# Patient Record
Sex: Male | Born: 2000 | Hispanic: No | Marital: Single | Smoking: Never smoker
Health system: Southern US, Community
[De-identification: ages and names within clinical notes are randomized; demographics above are authoritative.]

## PROBLEM LIST (undated history)

## (undated) DIAGNOSIS — Z789 Other specified health status: Secondary | ICD-10-CM

## (undated) HISTORY — PX: NO PAST SURGERIES: SHX2092

## (undated) HISTORY — DX: Other specified health status: Z78.9

---
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
## 2021-01-09 DIAGNOSIS — L209 Atopic dermatitis, unspecified: Secondary | ICD-10-CM | POA: Diagnosis not present

## 2021-01-09 DIAGNOSIS — R0982 Postnasal drip: Secondary | ICD-10-CM | POA: Diagnosis not present

## 2021-01-09 DIAGNOSIS — L272 Dermatitis due to ingested food: Secondary | ICD-10-CM | POA: Diagnosis not present

## 2021-01-12 DIAGNOSIS — Z87898 Personal history of other specified conditions: Secondary | ICD-10-CM | POA: Diagnosis not present

## 2021-01-12 DIAGNOSIS — L209 Atopic dermatitis, unspecified: Secondary | ICD-10-CM | POA: Diagnosis not present

## 2021-01-12 DIAGNOSIS — J3089 Other allergic rhinitis: Secondary | ICD-10-CM | POA: Diagnosis not present

## 2021-01-12 DIAGNOSIS — B369 Superficial mycosis, unspecified: Secondary | ICD-10-CM | POA: Diagnosis not present

## 2021-01-19 DIAGNOSIS — B369 Superficial mycosis, unspecified: Secondary | ICD-10-CM | POA: Diagnosis not present

## 2021-01-19 DIAGNOSIS — J3089 Other allergic rhinitis: Secondary | ICD-10-CM | POA: Diagnosis not present

## 2021-01-19 DIAGNOSIS — L209 Atopic dermatitis, unspecified: Secondary | ICD-10-CM | POA: Diagnosis not present

## 2021-01-19 DIAGNOSIS — Z87898 Personal history of other specified conditions: Secondary | ICD-10-CM | POA: Diagnosis not present

## 2021-01-20 DIAGNOSIS — L209 Atopic dermatitis, unspecified: Secondary | ICD-10-CM | POA: Diagnosis not present

## 2021-02-03 DIAGNOSIS — L209 Atopic dermatitis, unspecified: Secondary | ICD-10-CM | POA: Diagnosis not present

## 2021-02-03 DIAGNOSIS — Z87898 Personal history of other specified conditions: Secondary | ICD-10-CM | POA: Diagnosis not present

## 2021-02-03 DIAGNOSIS — J3089 Other allergic rhinitis: Secondary | ICD-10-CM | POA: Diagnosis not present

## 2021-02-03 DIAGNOSIS — B369 Superficial mycosis, unspecified: Secondary | ICD-10-CM | POA: Diagnosis not present

## 2021-03-11 DIAGNOSIS — J029 Acute pharyngitis, unspecified: Secondary | ICD-10-CM | POA: Diagnosis not present

## 2021-03-11 DIAGNOSIS — R509 Fever, unspecified: Secondary | ICD-10-CM | POA: Diagnosis not present

## 2021-03-11 DIAGNOSIS — J988 Other specified respiratory disorders: Secondary | ICD-10-CM | POA: Diagnosis not present

## 2021-03-11 DIAGNOSIS — Z20822 Contact with and (suspected) exposure to covid-19: Secondary | ICD-10-CM | POA: Diagnosis not present

## 2021-04-21 DIAGNOSIS — Z87898 Personal history of other specified conditions: Secondary | ICD-10-CM | POA: Diagnosis not present

## 2021-04-21 DIAGNOSIS — L209 Atopic dermatitis, unspecified: Secondary | ICD-10-CM | POA: Diagnosis not present

## 2021-04-21 DIAGNOSIS — J3089 Other allergic rhinitis: Secondary | ICD-10-CM | POA: Diagnosis not present

## 2021-04-21 DIAGNOSIS — B369 Superficial mycosis, unspecified: Secondary | ICD-10-CM | POA: Diagnosis not present

## 2021-05-12 DIAGNOSIS — J3089 Other allergic rhinitis: Secondary | ICD-10-CM | POA: Diagnosis not present

## 2021-05-12 DIAGNOSIS — L2089 Other atopic dermatitis: Secondary | ICD-10-CM | POA: Diagnosis not present

## 2021-06-02 DIAGNOSIS — Z9109 Other allergy status, other than to drugs and biological substances: Secondary | ICD-10-CM | POA: Diagnosis not present

## 2021-06-09 DIAGNOSIS — J3089 Other allergic rhinitis: Secondary | ICD-10-CM | POA: Diagnosis not present

## 2021-06-09 DIAGNOSIS — J302 Other seasonal allergic rhinitis: Secondary | ICD-10-CM | POA: Diagnosis not present

## 2021-06-16 DIAGNOSIS — J3089 Other allergic rhinitis: Secondary | ICD-10-CM | POA: Diagnosis not present

## 2021-06-16 DIAGNOSIS — J302 Other seasonal allergic rhinitis: Secondary | ICD-10-CM | POA: Diagnosis not present

## 2021-06-23 DIAGNOSIS — J3089 Other allergic rhinitis: Secondary | ICD-10-CM | POA: Diagnosis not present

## 2021-06-23 DIAGNOSIS — J302 Other seasonal allergic rhinitis: Secondary | ICD-10-CM | POA: Diagnosis not present

## 2021-06-30 DIAGNOSIS — J3089 Other allergic rhinitis: Secondary | ICD-10-CM | POA: Diagnosis not present

## 2021-06-30 DIAGNOSIS — J302 Other seasonal allergic rhinitis: Secondary | ICD-10-CM | POA: Diagnosis not present

## 2021-07-07 DIAGNOSIS — J302 Other seasonal allergic rhinitis: Secondary | ICD-10-CM | POA: Diagnosis not present

## 2021-07-07 DIAGNOSIS — J3089 Other allergic rhinitis: Secondary | ICD-10-CM | POA: Diagnosis not present

## 2021-07-14 DIAGNOSIS — J302 Other seasonal allergic rhinitis: Secondary | ICD-10-CM | POA: Diagnosis not present

## 2021-07-14 DIAGNOSIS — J3089 Other allergic rhinitis: Secondary | ICD-10-CM | POA: Diagnosis not present

## 2021-07-21 DIAGNOSIS — J302 Other seasonal allergic rhinitis: Secondary | ICD-10-CM | POA: Diagnosis not present

## 2021-07-21 DIAGNOSIS — J3089 Other allergic rhinitis: Secondary | ICD-10-CM | POA: Diagnosis not present

## 2021-07-23 DIAGNOSIS — L2089 Other atopic dermatitis: Secondary | ICD-10-CM | POA: Diagnosis not present

## 2021-07-23 DIAGNOSIS — J3089 Other allergic rhinitis: Secondary | ICD-10-CM | POA: Diagnosis not present

## 2021-07-28 DIAGNOSIS — J3089 Other allergic rhinitis: Secondary | ICD-10-CM | POA: Diagnosis not present

## 2021-07-28 DIAGNOSIS — J302 Other seasonal allergic rhinitis: Secondary | ICD-10-CM | POA: Diagnosis not present

## 2021-08-04 DIAGNOSIS — J302 Other seasonal allergic rhinitis: Secondary | ICD-10-CM | POA: Diagnosis not present

## 2021-08-04 DIAGNOSIS — J3089 Other allergic rhinitis: Secondary | ICD-10-CM | POA: Diagnosis not present

## 2021-08-11 DIAGNOSIS — J3089 Other allergic rhinitis: Secondary | ICD-10-CM | POA: Diagnosis not present

## 2021-08-11 DIAGNOSIS — J302 Other seasonal allergic rhinitis: Secondary | ICD-10-CM | POA: Diagnosis not present

## 2021-08-24 ENCOUNTER — Encounter: Payer: Self-pay | Admitting: Family Medicine

## 2021-08-24 ENCOUNTER — Ambulatory Visit (INDEPENDENT_AMBULATORY_CARE_PROVIDER_SITE_OTHER): Payer: Medicaid Other | Admitting: Family Medicine

## 2021-08-24 VITALS — BP 116/71 | HR 73 | Temp 98.0°F | Resp 16 | Ht 66.0 in | Wt 170.8 lb

## 2021-08-24 DIAGNOSIS — Z7689 Persons encountering health services in other specified circumstances: Secondary | ICD-10-CM | POA: Diagnosis not present

## 2021-08-24 DIAGNOSIS — Z0000 Encounter for general adult medical examination without abnormal findings: Secondary | ICD-10-CM | POA: Diagnosis not present

## 2021-08-24 NOTE — Progress Notes (Signed)
Patient is a 21 yo male here to established care with pcp.  Patient has no concerns for PCP

## 2021-08-25 ENCOUNTER — Encounter: Payer: Self-pay | Admitting: Family Medicine

## 2021-08-25 DIAGNOSIS — J302 Other seasonal allergic rhinitis: Secondary | ICD-10-CM | POA: Diagnosis not present

## 2021-08-25 DIAGNOSIS — J3089 Other allergic rhinitis: Secondary | ICD-10-CM | POA: Diagnosis not present

## 2021-08-25 LAB — CMP14+EGFR
ALT: 14 IU/L (ref 0–44)
AST: 18 IU/L (ref 0–40)
Albumin/Globulin Ratio: 1.8 (ref 1.2–2.2)
Albumin: 4.6 g/dL (ref 4.1–5.2)
Alkaline Phosphatase: 80 IU/L (ref 51–125)
BUN/Creatinine Ratio: 12 (ref 9–20)
BUN: 12 mg/dL (ref 6–20)
Bilirubin Total: 1 mg/dL (ref 0.0–1.2)
CO2: 24 mmol/L (ref 20–29)
Calcium: 9.3 mg/dL (ref 8.7–10.2)
Chloride: 105 mmol/L (ref 96–106)
Creatinine, Ser: 0.97 mg/dL (ref 0.76–1.27)
Globulin, Total: 2.5 g/dL (ref 1.5–4.5)
Glucose: 78 mg/dL (ref 70–99)
Potassium: 4 mmol/L (ref 3.5–5.2)
Sodium: 141 mmol/L (ref 134–144)
Total Protein: 7.1 g/dL (ref 6.0–8.5)
eGFR: 115 mL/min/{1.73_m2} (ref >59)

## 2021-08-25 LAB — CBC WITH DIFFERENTIAL/PLATELET
Basophils Absolute: 0 10*3/uL (ref 0.0–0.2)
Basos: 1 %
EOS (ABSOLUTE): 0.2 10*3/uL (ref 0.0–0.4)
Eos: 3 %
Hematocrit: 42.7 % (ref 37.5–51.0)
Hemoglobin: 14.4 g/dL (ref 13.0–17.7)
Immature Grans (Abs): 0 10*3/uL (ref 0.0–0.1)
Immature Granulocytes: 0 %
Lymphocytes Absolute: 2 10*3/uL (ref 0.7–3.1)
Lymphs: 32 %
MCH: 31.2 pg (ref 26.6–33.0)
MCHC: 33.7 g/dL (ref 31.5–35.7)
MCV: 93 fL (ref 79–97)
Monocytes Absolute: 0.4 10*3/uL (ref 0.1–0.9)
Monocytes: 7 %
Neutrophils Absolute: 3.5 10*3/uL (ref 1.4–7.0)
Neutrophils: 57 %
Platelets: 307 10*3/uL (ref 150–450)
RBC: 4.61 x10E6/uL (ref 4.14–5.80)
RDW: 12.7 % (ref 11.6–15.4)
WBC: 6.1 10*3/uL (ref 3.4–10.8)

## 2021-08-25 LAB — LIPID PANEL
Chol/HDL Ratio: 2.2 ratio (ref 0.0–5.0)
Cholesterol, Total: 149 mg/dL (ref 100–199)
HDL: 68 mg/dL (ref >39)
LDL Chol Calc (NIH): 63 mg/dL (ref 0–99)
Triglycerides: 96 mg/dL (ref 0–149)
VLDL Cholesterol Cal: 18 mg/dL (ref 5–40)

## 2021-08-25 NOTE — Progress Notes (Signed)
New Patient Office Visit  Subjective    Patient ID: Larry Morales, male    DOB: 2000-04-08  Age: 21 y.o. MRN: [id number]  CC:  Chief Complaint  Patient presents with   Establish Care   Annual Exam    HPI Larry Morales presents to establish care and for routine annual exam.    Outpatient Encounter Medications as of 08/24/2021  Medication Sig   [DISCONTINUED] clindamycin-benzoyl peroxide (BENZACLIN) gel Apply topically 2 (two) times daily.   cetirizine (ZYRTEC) 10 MG tablet Take 10 mg by mouth daily.   [DISCONTINUED] BENZOYL PEROXIDE 5 % external wash APPLY TOPICALLY AT BEDTIME. TO THE BACK (Patient not taking: Reported on 03/10/2020)   [DISCONTINUED] tretinoin (RETIN-A) 0.05 % cream Apply topically at bedtime. Use small amount on clean dry skin.  Moisturize well.  Use additional moisturizer as often as needed. (Patient not taking: Reported on 03/10/2020)   No facility-administered encounter medications on file as of 08/24/2021.    Past Medical History:  Diagnosis Date   Chest pain at rest 12/29/2014   Seen by [facility] peds cardiology - 12 ECG normal intervals, normal exam.  Cleared for sports; no follow up.   Medical history non-contributory     Past Surgical History:  Procedure Laterality Date   NO PAST SURGERIES      Family History  Problem Relation Age of Onset   Hypertension Maternal Grandmother    Asthma Cousin    Early death Neg Hx    Hearing loss Neg Hx     Social History   Socioeconomic History   Marital status: Single    Spouse name: Not on file   Number of children: Not on file   Years of education: Not on file   Highest education level: Not on file  Occupational History   Not on file  Tobacco Use   Smoking status: Never   Smokeless tobacco: Never  Substance and Sexual Activity   Alcohol use: Not Currently   Drug use: Not Currently   Sexual activity: Not Currently  Other Topics Concern   Not on file  Social History Narrative   He  lives with his parents and sister. No pets. No passive smoke exposure. Going into sixth grade.   Social Determinants of Health   Financial Resource Strain: Not on file  Food Insecurity: Not on file  Transportation Needs: Not on file  Physical Activity: Not on file  Stress: Not on file  Social Connections: Not on file  Intimate Partner Violence: Not on file    Review of Systems  All other systems reviewed and are negative.       Objective    BP 116/71   Pulse 73   Temp 98 F (36.7 C) (Oral)   Resp 16   Ht 5' 6"  (1.676 m)   Wt 170 lb 12.8 oz (77.5 kg)   SpO2 96%   BMI 27.57 kg/m   Physical Exam Vitals and nursing note reviewed.  Constitutional:      General: He is not in acute distress. HENT:     Head: Normocephalic and atraumatic.     Right Ear: Tympanic membrane, ear canal and external ear normal.     Left Ear: Tympanic membrane, ear canal and external ear normal.     Nose: Nose normal.     Mouth/Throat:     Mouth: Mucous membranes are moist.     Pharynx: Oropharynx is clear.  Eyes:     Conjunctiva/sclera: Conjunctivae  normal.     Pupils: Pupils are equal, round, and reactive to light.  Neck:     Thyroid: No thyromegaly.  Cardiovascular:     Rate and Rhythm: Normal rate and regular rhythm.     Heart sounds: Normal heart sounds. No murmur heard. Pulmonary:     Effort: Pulmonary effort is normal.     Breath sounds: Normal breath sounds.  Abdominal:     General: There is no distension.     Palpations: Abdomen is soft. There is no mass.     Tenderness: There is no abdominal tenderness.     Hernia: There is no hernia in the left inguinal area or right inguinal area.  Genitourinary:    Penis: Normal and uncircumcised.      Testes: Normal.  Musculoskeletal:        General: Normal range of motion.     Cervical back: Normal range of motion and neck supple.     Right lower leg: No edema.     Left lower leg: No edema.  Skin:    General: Skin is warm and dry.   Neurological:     General: No focal deficit present.     Mental Status: He is alert and oriented to person, place, and time. Mental status is at baseline.  Psychiatric:        Mood and Affect: Mood normal.        Behavior: Behavior normal.         Assessment & Plan:   1. Annual physical exam Routine labs ordered - CMP14+EGFR - CBC with Differential - Lipid Panel  2. Encounter to establish care     No follow-ups on file.   Becky Sax, MD

## 2021-09-08 DIAGNOSIS — J302 Other seasonal allergic rhinitis: Secondary | ICD-10-CM | POA: Diagnosis not present

## 2021-09-08 DIAGNOSIS — J3089 Other allergic rhinitis: Secondary | ICD-10-CM | POA: Diagnosis not present

## 2021-09-15 DIAGNOSIS — J3089 Other allergic rhinitis: Secondary | ICD-10-CM | POA: Diagnosis not present

## 2021-09-15 DIAGNOSIS — J302 Other seasonal allergic rhinitis: Secondary | ICD-10-CM | POA: Diagnosis not present

## 2021-09-16 DIAGNOSIS — J3089 Other allergic rhinitis: Secondary | ICD-10-CM | POA: Diagnosis not present

## 2021-09-16 DIAGNOSIS — L2089 Other atopic dermatitis: Secondary | ICD-10-CM | POA: Diagnosis not present

## 2021-09-22 DIAGNOSIS — J3089 Other allergic rhinitis: Secondary | ICD-10-CM | POA: Diagnosis not present

## 2021-09-22 DIAGNOSIS — J302 Other seasonal allergic rhinitis: Secondary | ICD-10-CM | POA: Diagnosis not present

## 2021-09-29 DIAGNOSIS — J302 Other seasonal allergic rhinitis: Secondary | ICD-10-CM | POA: Diagnosis not present

## 2021-09-29 DIAGNOSIS — J3089 Other allergic rhinitis: Secondary | ICD-10-CM | POA: Diagnosis not present

## 2021-10-07 DIAGNOSIS — Z9109 Other allergy status, other than to drugs and biological substances: Secondary | ICD-10-CM | POA: Diagnosis not present

## 2021-10-15 DIAGNOSIS — Z9109 Other allergy status, other than to drugs and biological substances: Secondary | ICD-10-CM | POA: Diagnosis not present

## 2021-10-22 DIAGNOSIS — Z9109 Other allergy status, other than to drugs and biological substances: Secondary | ICD-10-CM | POA: Diagnosis not present

## 2021-10-29 DIAGNOSIS — Z9109 Other allergy status, other than to drugs and biological substances: Secondary | ICD-10-CM | POA: Diagnosis not present

## 2021-11-05 DIAGNOSIS — Z9109 Other allergy status, other than to drugs and biological substances: Secondary | ICD-10-CM | POA: Diagnosis not present

## 2021-11-12 DIAGNOSIS — Z9109 Other allergy status, other than to drugs and biological substances: Secondary | ICD-10-CM | POA: Diagnosis not present

## 2021-11-19 DIAGNOSIS — Z9109 Other allergy status, other than to drugs and biological substances: Secondary | ICD-10-CM | POA: Diagnosis not present

## 2021-11-22 DIAGNOSIS — L2089 Other atopic dermatitis: Secondary | ICD-10-CM | POA: Diagnosis not present

## 2021-11-22 DIAGNOSIS — J3089 Other allergic rhinitis: Secondary | ICD-10-CM | POA: Diagnosis not present

## 2021-11-26 DIAGNOSIS — Z9109 Other allergy status, other than to drugs and biological substances: Secondary | ICD-10-CM | POA: Diagnosis not present

## 2021-12-17 DIAGNOSIS — Z9109 Other allergy status, other than to drugs and biological substances: Secondary | ICD-10-CM | POA: Diagnosis not present

## 2021-12-31 DIAGNOSIS — Z9109 Other allergy status, other than to drugs and biological substances: Secondary | ICD-10-CM | POA: Diagnosis not present

## 2022-01-07 DIAGNOSIS — Z9109 Other allergy status, other than to drugs and biological substances: Secondary | ICD-10-CM | POA: Diagnosis not present

## 2022-01-14 DIAGNOSIS — Z9109 Other allergy status, other than to drugs and biological substances: Secondary | ICD-10-CM | POA: Diagnosis not present

## 2022-01-28 DIAGNOSIS — Z9109 Other allergy status, other than to drugs and biological substances: Secondary | ICD-10-CM | POA: Diagnosis not present

## 2022-02-04 DIAGNOSIS — Z9109 Other allergy status, other than to drugs and biological substances: Secondary | ICD-10-CM | POA: Diagnosis not present

## 2022-02-11 DIAGNOSIS — Z9109 Other allergy status, other than to drugs and biological substances: Secondary | ICD-10-CM | POA: Diagnosis not present
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
## 2022-03-11 DIAGNOSIS — Z9109 Other allergy status, other than to drugs and biological substances: Secondary | ICD-10-CM | POA: Diagnosis not present

## 2022-03-18 DIAGNOSIS — Z9109 Other allergy status, other than to drugs and biological substances: Secondary | ICD-10-CM | POA: Diagnosis not present

## 2022-03-25 DIAGNOSIS — Z9109 Other allergy status, other than to drugs and biological substances: Secondary | ICD-10-CM | POA: Diagnosis not present

## 2022-04-01 DIAGNOSIS — Z9109 Other allergy status, other than to drugs and biological substances: Secondary | ICD-10-CM | POA: Diagnosis not present

## 2022-06-21 DIAGNOSIS — J3089 Other allergic rhinitis: Secondary | ICD-10-CM | POA: Diagnosis not present

## 2022-06-21 DIAGNOSIS — L2089 Other atopic dermatitis: Secondary | ICD-10-CM | POA: Diagnosis not present

## 2022-07-22 DIAGNOSIS — Z9109 Other allergy status, other than to drugs and biological substances: Secondary | ICD-10-CM | POA: Diagnosis not present

## 2022-07-29 DIAGNOSIS — Z9109 Other allergy status, other than to drugs and biological substances: Secondary | ICD-10-CM | POA: Diagnosis not present

## 2022-08-12 DIAGNOSIS — Z9109 Other allergy status, other than to drugs and biological substances: Secondary | ICD-10-CM | POA: Diagnosis not present

## 2022-08-30 DIAGNOSIS — L2089 Other atopic dermatitis: Secondary | ICD-10-CM | POA: Diagnosis not present

## 2022-08-30 DIAGNOSIS — J3089 Other allergic rhinitis: Secondary | ICD-10-CM | POA: Diagnosis not present

## 2022-10-07 DIAGNOSIS — Z9109 Other allergy status, other than to drugs and biological substances: Secondary | ICD-10-CM | POA: Diagnosis not present

## 2022-11-30 DIAGNOSIS — L700 Acne vulgaris: Secondary | ICD-10-CM | POA: Diagnosis not present

## 2022-11-30 DIAGNOSIS — L2084 Intrinsic (allergic) eczema: Secondary | ICD-10-CM | POA: Diagnosis not present

## 2022-12-02 DIAGNOSIS — Z9109 Other allergy status, other than to drugs and biological substances: Secondary | ICD-10-CM | POA: Diagnosis not present

## 2023-01-10 DIAGNOSIS — J3089 Other allergic rhinitis: Secondary | ICD-10-CM | POA: Diagnosis not present

## 2023-01-10 DIAGNOSIS — L2089 Other atopic dermatitis: Secondary | ICD-10-CM | POA: Diagnosis not present

## 2023-03-22 DIAGNOSIS — J309 Allergic rhinitis, unspecified: Secondary | ICD-10-CM | POA: Diagnosis not present

## 2023-03-22 DIAGNOSIS — L2089 Other atopic dermatitis: Secondary | ICD-10-CM | POA: Diagnosis not present

## 2023-06-23 DIAGNOSIS — M79671 Pain in right foot: Secondary | ICD-10-CM | POA: Diagnosis not present

## 2023-06-23 DIAGNOSIS — S93491A Sprain of other ligament of right ankle, initial encounter: Secondary | ICD-10-CM | POA: Diagnosis not present
